# Patient Record
Sex: Male | Born: 1970 | Race: White | Hispanic: No | Marital: Single | Smoking: Current every day smoker
Health system: Southern US, Community
[De-identification: ages and names within clinical notes are randomized; demographics above are authoritative.]

## PROBLEM LIST (undated history)

## (undated) HISTORY — PX: SHOULDER SURGERY: SHX246

---

## 1998-09-30 ENCOUNTER — Ambulatory Visit (HOSPITAL_BASED_OUTPATIENT_CLINIC_OR_DEPARTMENT_OTHER): Admission: RE | Admit: 1998-09-30 | Discharge: 1998-09-30 | Payer: Self-pay | Admitting: Orthopedic Surgery

## 2006-06-04 ENCOUNTER — Emergency Department (HOSPITAL_COMMUNITY): Admission: EM | Admit: 2006-06-04 | Discharge: 2006-06-04 | Payer: Self-pay | Admitting: Emergency Medicine

## 2006-06-12 ENCOUNTER — Ambulatory Visit (HOSPITAL_COMMUNITY): Admission: RE | Admit: 2006-06-12 | Discharge: 2006-06-12 | Payer: Self-pay | Admitting: Orthopedic Surgery

## 2006-06-16 ENCOUNTER — Ambulatory Visit (HOSPITAL_COMMUNITY): Admission: RE | Admit: 2006-06-16 | Discharge: 2006-06-16 | Payer: Self-pay | Admitting: Orthopedic Surgery

## 2011-11-29 ENCOUNTER — Emergency Department (HOSPITAL_BASED_OUTPATIENT_CLINIC_OR_DEPARTMENT_OTHER)
Admission: EM | Admit: 2011-11-29 | Discharge: 2011-11-29 | Disposition: A | Discharge status: Home / Self Care | Payer: Self-pay | Attending: Emergency Medicine | Admitting: Emergency Medicine

## 2011-11-29 ENCOUNTER — Encounter (HOSPITAL_BASED_OUTPATIENT_CLINIC_OR_DEPARTMENT_OTHER): Payer: Self-pay | Admitting: Family Medicine

## 2011-11-29 DIAGNOSIS — X500XXA Overexertion from strenuous movement or load, initial encounter: Secondary | ICD-10-CM | POA: Insufficient documentation

## 2011-11-29 DIAGNOSIS — IMO0002 Reserved for concepts with insufficient information to code with codable children: Secondary | ICD-10-CM | POA: Insufficient documentation

## 2011-11-29 DIAGNOSIS — Z880 Allergy status to penicillin: Secondary | ICD-10-CM | POA: Insufficient documentation

## 2011-11-29 DIAGNOSIS — F17200 Nicotine dependence, unspecified, uncomplicated: Secondary | ICD-10-CM | POA: Insufficient documentation

## 2011-11-29 DIAGNOSIS — S46219A Strain of muscle, fascia and tendon of other parts of biceps, unspecified arm, initial encounter: Secondary | ICD-10-CM

## 2011-11-29 MED ORDER — OXYCODONE-ACETAMINOPHEN 5-325 MG PO TABS
2.0000 | ORAL_TABLET | Freq: Once | ORAL | Status: AC
  Administered 2011-11-29: 2 via ORAL
  Filled 2011-11-29: qty 2

## 2011-11-29 MED ORDER — OXYCODONE-ACETAMINOPHEN 5-325 MG PO TABS: 2.0000 | ORAL_TABLET | Freq: Four times a day (QID) | ORAL | Status: AC | PRN

## 2011-11-29 MED ORDER — IBUPROFEN 800 MG PO TABS
800.0000 mg | ORAL_TABLET | Freq: Once | ORAL | Status: AC
  Administered 2011-11-29: 800 mg via ORAL
  Filled 2011-11-29: qty 1

## 2011-11-29 MED ORDER — IBUPROFEN 800 MG PO TABS: 800.0000 mg | ORAL_TABLET | Freq: Three times a day (TID) | ORAL | Status: AC | PRN

## 2011-11-29 NOTE — ED Notes (Signed)
MD at bedside. 

## 2011-11-29 NOTE — ED Notes (Signed)
Pt c/o right bicep pain after lifting heavy object and falling. Pt denies other injury, no loc. Pt reports h/o "tearing other bicep and it feels the same".

## 2011-11-29 NOTE — Discharge Instructions (Signed)
Biceps Tendon Disruption (Proximal) with Rehab The biceps tendon attaches the biceps muscle to the bones of the elbow and the shoulder. A proximal biceps tendon disruption is a tear of the long head of the tendon that attaches near the shoulder (more common) or a tear in the muscle near the muscle tendon junction (less common). These injuries usually involve a complete tear of the tendon from the bone. However, partial tears are also possible. The biceps muscle works with other muscles to bend the elbow and rotate the palm upward (supinate). A complete biceps rupture will result in about a 10% decrease in elbow bending strength and a 20% decrease in your ability to turn the palm upward, using the wrist. SYMPTOMS   Pain, tenderness, swelling, warmth, or redness over the front of the shoulder.   Pain that gets worse with shoulder and elbow use, especially against resistance (lifting or carrying).   Bruising (contusion) in the arm or elbow after 24 to 48 hours.   Bulge can be seen and felt in the arm.   Limited motion of the shoulder or elbow.   Weakness with attempted elbow bending or rotation of the wrist, such as when using a screwdriver.   Crackling sound (crepitation) when the tendon or shoulder is moved or touched.  CAUSES  A biceps tendon rupture occurs when the tendon is subjected to a force that is greater than it can withstand. For example, a sudden force straightening the elbow while the biceps is flexed, or a direct hit (trauma) (rare). RISK INCREASES WITH:   Sports that involve contact, or throwing and overhead activities (racquet sports, gymnastics, weightlifting, bodybuilding).   Heavy labor.   Poor strength and flexibility.   Failure to warm up properly before activity.  PREVENTION  Warm up and stretch properly before activity.   Maintain physical fitness:   Strength, flexibility, and endurance.   Cardiovascular fitness.   Allow your body to recover between  practices and competition.   Learn and use proper exercise technique.  PROGNOSIS  If treated properly, the symptoms of a proximal biceps tendon disruption usually go away within 12 weeks of injury.  RELATED COMPLICATIONS  Longer healing time if not properly treated, or if not given enough time to heal.   Recurring symptoms, especially if activity is resumed too soon.   Weakness of elbow bending and forearm rotation.   Prolonged disability (uncommon).  TREATMENT Treatment first involves the use of ice and medicine, to reduce pain and inflammation. It is also important to perform strengthening and stretching exercises and to modify activities that cause symptoms to get worse. These exercises may be performed at home or with a therapist. It is not possible to surgically fix the tendon (sew it together). However, surgery may be performed to reinsert the tendon into the arm bone. This will make the arm look normal again. Surgery is most often advised for younger, active individuals, especially those who require strength of wrist rotation.  MEDICATION  If pain medicine is needed, nonsteroidal anti-inflammatory medicines (aspirin and ibuprofen), or other minor pain relievers (acetaminophen), are often advised.   Do not take pain medicine for 7 days before surgery.   Prescription pain relievers may be given if your caregiver thinks they are needed. Use only as directed and only as much as you need.   Corticosteroid injections may be given to help reduce inflammation, but are not usually recommended for this injury.  HEAT AND COLD  Cold treatment (icing) should be applied  for 10 to 15 minutes every 2 to 3 hours for inflammation and pain, and immediately after activity that aggravates your symptoms. Use ice packs or an ice massage.   Heat treatment may be used before performing stretching and strengthening activities prescribed by your caregiver, physical therapist, or athletic trainer. Use a heat  pack or a warm water soak.  SEEK MEDICAL CARE IF:   Symptoms get worse or do not improve in 2 weeks, despite treatment.   New, unexplained symptoms develop. (Drugs used in treatment may produce side effects.)  EXERCISES RANGE OF MOTION (ROM) AND STRETCHING EXERCISES - Biceps Tendon Disruption (Proximal) These exercises may help you when beginning to rehabilitate your injury. Your symptoms may go away with or without further involvement from your physician, physical therapist or athletic trainer. While completing these exercises, remember:   Restoring tissue flexibility helps normal motion to return to the joints. This allows healthier, less painful movement and activity.   An effective stretch should be held for at least 30 seconds.   A stretch should never be painful. You should only feel a gentle lengthening or release in the stretched tissue.  STRETCH - Flexion, Standing  Stand with good posture. With an underhand grip on your right / left hand and an overhand grip on the opposite hand, grasp a broomstick or cane so that your hands are a little more than shoulder width apart.   Keeping your right / left elbow straight and shoulder muscles relaxed, push the stick with your opposite hand to raise your right / left arm in front of your body and then overhead. Raise your arm until you feel a stretch in your right / left shoulder, but before you have increased shoulder pain.   Try to avoid shrugging your right / left shoulder as your arm rises by keeping your shoulder blade tucked down and toward your mid-back spine. Hold __________ seconds.   Slowly return to the starting position.  Repeat __________ times. Complete this exercise __________ times per day. STRETCH - Abduction, Supine  Stand with good posture. With an underhand grip on your right / left hand and an overhand grip on the opposite hand, grasp a broomstick or cane so that your hands are a little more than shoulder-width apart.     Keeping your right / left elbow straight and shoulder muscles relaxed, push the stick with your opposite hand to raise your right / left arm out to the side of your body and then overhead. Raise your arm until you feel a stretch in your right / left shoulder, but before you have increased shoulder pain.   Try to avoid shrugging your right / left shoulder as your arm rises, by keeping your shoulder blade tucked down and toward your mid-back spine. Hold for __________ seconds.   Slowly return to the starting position.  Repeat __________ times. Complete this exercise __________ times per day. ROM - Flexion, Active-Assisted  Lie on your back. You may bend your knees for comfort.   Grasp a broomstick or cane so your hands are about shoulder width apart. Your right / left hand should grip the end of the stick so that your hand is positioned "thumbs-up," as if you were about to shake hands.   Using your healthy arm to lead, raise your right / left arm overhead until you feel a gentle stretch in your shoulder. Hold for right / left seconds.   Use the stick to assist in returning your right /  left arm to its starting position.  Repeat __________ times. Complete this exercise __________ times per day.  STRETCH - Flexion, Standing   Stand facing a wall. Walk your right / left fingers up the wall until you feel a moderate stretch in your shoulder. As your hand gets higher, you may need to step closer to the wall or use a door frame to walk through.   Try to avoid shrugging your right / left shoulder as your arm rises, by keeping your shoulder blade tucked down and toward your mid-back spine.   Hold for __________ seconds. Use your other hand, if needed, to ease out of the stretch and return to the starting position.  Repeat __________ times. Complete this exercise __________ times per day.  ROM - Internal Rotation   Using underhand grips, grasp a stick behind your back with both hands.   While  standing upright with good posture, slide the stick up your back until you feel a mild stretch in the front of your shoulder.   Hold for __________ seconds. Slowly return to your starting position.  Repeat __________ times. Complete this exercise __________ times per day.  STRETCH - Internal Rotation  Place your right / left hand behind your back, palm-up.   Throw a towel or belt over your opposite shoulder. Grasp the towel with your right / left hand.   While keeping an upright posture, gently pull up on the towel until you feel a stretch in the front of your right / left shoulder.   Avoid shrugging your right / left shoulder as your arm rises, by keeping your shoulder blade tucked down and toward your mid-back spine.   Hold for __________ seconds. Release the stretch by lowering your opposite hand.  Repeat __________ times. Complete this exercise __________ times per day. STRETCH - Elbow Flexors   Lie on a firm bed or countertop on your back. Be sure that you are in a comfortable position which will allow you to relax your arm muscles.   Place a folded towel under your right / left upper arm, so that your elbow and shoulder are at the same height. Extend your arm; your elbow should not rest on the bed or towel.   Allow the weight of your hand to straighten your elbow. Keep your arm and chest muscles relaxed. Your caregiver may ask you to increase the intensity of your stretch by adding a small wrist or hand weight.   Hold for __________ seconds. You should feel a stretch on the inside of your elbow. Slowly return to the starting position.  Repeat __________ times. Complete this exercise __________ times per day. STRENGTHENING EXERCISES - Biceps Tendon Disruption (Proximal) These exercises may help you regain your strength after your physician has discontinued your restraint in a cast or brace. They may resolve your symptoms with or without further involvement from your physician,  physical therapist or athletic trainer. While completing these exercises, remember:   Muscles can gain both the endurance and the strength needed for everyday activities through controlled exercises.   Complete these exercises as instructed by your physician, physical therapist or athletic trainer. Progress the resistance and repetitions only as guided.   You may experience muscle soreness or fatigue, but the pain or discomfort you are trying to eliminate should never worsen during these exercises. If this pain does get worse, stop and make sure you are following the directions exactly. If the pain is still present after adjustments, discontinue the exercise  until you can discuss the trouble with your clinician.  STRENGTH - Elbow Flexors, Isometric   Stand or sit upright on a firm surface. Place your right / left arm so that your hand is palm-up and at the height of your waist.   Place your opposite hand on top of your forearm. Gently push down as your right / left arm resists. Push as hard as you can with both arms, without causing any pain or movement at your right / left elbow. Hold this stationary position for __________ seconds.   Gradually release the tension in both arms. Allow your muscles to relax completely before repeating.  Repeat __________ times. Complete this exercise __________ times per day. STRENGTH - Shoulder Flexion, Isometric  With good posture and facing a wall, stand or sit about 4-6 inches away.   Keeping your right / left elbow straight, gently press the top of your fist into the wall. Increase the pressure gradually until you are pressing as hard as you can without shrugging your shoulder or increasing any shoulder discomfort.   Hold for __________ seconds.   Release the tension slowly. Relax your shoulder muscles completely before you the next repetition.  Repeat __________ times. Complete this exercise __________ times per day.  STRENGTH - Elbow Flexors,  Supinated  With good posture, stand or sit on a firm chair without armrests. Allow your right / left arm to rest at your side with your palm facing forward.   Holding a __________weight or gripping a rubber exercise band or tubing, bring your hand toward your shoulder.   Allow your muscles to control the resistance as your hand returns to your side.  Repeat __________ times. Complete this exercise __________ times per day.  STRENGTH - Elbow Flexors, Neutral  With good posture, stand or sit on a firm chair without armrests. Allow your right / left arm to rest at your side with your thumb facing forward.   Holding a __________ weight or gripping a rubber exercise band or tubing, bring your hand toward your shoulder.   Allow your muscles to control the resistance as your hand returns to your side.  Repeat __________ times. Complete this exercise __________ times per day.  STRENGTH - Shoulder Flexion   Stand or sit with good posture. Grasp a __________ weight, or an exercise band or tubing, so that your hand is "thumbs-up," like when you shake hands.   Slowly lift your right / left arm as far as you can without increasing any shoulder pain. Initially, many people can only raise their hand to shoulder height.   Avoid shrugging your right / left shoulder as your arm rises, by keeping your shoulder blade tucked down and toward your mid-back spine.   Hold for __________ seconds. Control the descent of your hand as you slowly return to your starting position.  Repeat __________ times. Complete this exercise __________ times per day.  STRENGTH - Forearm Supinators   Sit with your right / left forearm supported on a table, keeping your elbow below shoulder height. Rest your hand over the edge, palm down.   Gently grip a hammer or a soup ladle.   Without moving your elbow, slowly turn your palm and hand upward to a "thumbs-up" position.   Hold this position for __________ seconds. Slowly return  to the starting position.  Repeat __________ times. Complete this exercise __________ times per day.  STRENGTH - Forearm Pronators   Sit with your right / left forearm supported on  a table, keeping your elbow below shoulder height. Rest your hand over the edge, palm up.   Gently grip a hammer or a soup ladle.   Without moving your elbow, slowly turn your palm and hand upward to a "thumbs-up" position.   Hold this position for __________ seconds. Slowly return to the starting position.  Repeat __________ times. Complete this exercise __________ times per day.  Document Released: 08/22/2005 Document Revised: 08/11/2011 Document Reviewed: 12/04/2008 [facility] Patient Information 2012 [place], [place].

## 2011-11-29 NOTE — ED Provider Notes (Signed)
History     CSN: [id number]  Arrival date & time 11/29/11  1191   First MD Initiated Contact with Patient 11/29/11 1014      Chief Complaint  Patient presents with  . Arm Pain    (Consider location/radiation/quality/duration/timing/severity/associated sxs/prior treatment) HPI Patient is a 41 yo male who presents after developing sudden pain and swelling in his right upper arm.  This occurred when patient was carrying some heavy furniture which was suddenly jostled.  The patient felt a pop and now complains of pain that is 8/10 and similar to when he ruptured his left biceps tendon some years ago.  Pain is worse with palpation and movement and better with rest.There are no other associated or modifying factors.  History reviewed. No pertinent past medical history.  Past Surgical History  Procedure Date  . Shoulder surgery     History reviewed. No pertinent family history.  History  Substance Use Topics  . Smoking status: Current Everyday Smoker -- 1.0 packs/day    Types: Cigarettes  . Smokeless tobacco: Not on file  . Alcohol Use: Yes      Review of Systems  Constitutional: Negative.   HENT: Negative.   Eyes: Negative.   Respiratory: Negative.   Cardiovascular: Negative.   Gastrointestinal: Negative.   Genitourinary: Negative.   Musculoskeletal:       See HPI  Skin: Negative.   Neurological: Negative.   Hematological: Negative.   Psychiatric/Behavioral: Negative.   All other systems reviewed and are negative.    Allergies  Penicillins  Home Medications   Current Outpatient Rx  Name Route Sig Dispense Refill  . IBUPROFEN 800 MG PO TABS Oral Take 1 tablet (800 mg total) by mouth every 8 (eight) hours as needed for pain. 30 tablet 0  . OXYCODONE-ACETAMINOPHEN 5-325 MG PO TABS Oral Take 2 tablets by mouth every 6 (six) hours as needed for pain. 30 tablet 0    BP 122/79  Pulse 68  Temp(Src) 97.6 F (36.4 C) (Oral)  Resp 18  Ht 5\' 7"  (1.702 m)  Wt 178  lb (80.74 kg)  BMI 27.88 kg/m2  SpO2 99%  Physical Exam  Nursing note and vitals reviewed. GEN: Well-developed, well-nourished male in no distress HEENT: Atraumatic, normocephalic.  EYES: PERRLA BL, no scleral icterus. NECK: Trachea midline, no meningismus CV: regular rate and rhythm.  PULM: No respiratory distress.   GI: soft, non-tender. No guarding, rebound, or tenderness. + bowel sounds  GU: deferred Neuro: cranial nerves 2-12 intact, no abnormalities of strength or sensation, A and O x 3 MSK: swelling and TTP over the distal portion of the right upper arm consistent with biceps tendon rupture.  The RUE is NVI Skin: No rashes petechiae, purpura, or jaundice Psych: no abnormality of mood   ED Course  Procedures (including critical care time)  Labs Reviewed - No data to display No results found.   1. Biceps tendon rupture       MDM  Patient was evaluated by myself.  Presentation was consistent with biceps tendon rupture.  He was treated with pain medications as well as ACE wrap, sling, ice pack, and NSAIDs.  Patient was referred to ortho on call and also to Dr. Pearletha Forge of sports medicine as the patient was interested in non-surgical options as well.  The patient was discharged in good condition with prescriptions.          Cyndra Numbers, MD 11/29/11 [phone]

## 2011-11-30 ENCOUNTER — Encounter: Payer: Self-pay | Admitting: Family Medicine

## 2011-11-30 ENCOUNTER — Ambulatory Visit (INDEPENDENT_AMBULATORY_CARE_PROVIDER_SITE_OTHER): Payer: Self-pay | Admitting: Family Medicine

## 2011-11-30 VITALS — BP 134/78 | HR 61 | Temp 97.6°F | Ht 68.0 in | Wt 178.0 lb

## 2011-11-30 DIAGNOSIS — M25519 Pain in unspecified shoulder: Secondary | ICD-10-CM

## 2011-11-30 DIAGNOSIS — M25511 Pain in right shoulder: Secondary | ICD-10-CM

## 2011-11-30 NOTE — Patient Instructions (Signed)
You have torn the long head of your biceps tendon. In most instances this is not treated with surgery unless you do not improve with conservative care (as opposed to when you tear it at the elbow - these should be repaired). 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 Start with sling for comfort. Icing 15 minutes at a time 3-4 times a day. Aleve 2 tabs twice a day with food for pain and inflammation. Percocet as needed for severe pain. Come out of sling a couple times a day to do elbow and shoulder range of motion exercises. When you feel comfortable, start arm curls and supination exercises - start with low weight and theraband, work your way up. Follow up with me in 6 weeks for a recheck on how you're doing. Resuming activities depends on your symptoms. You can drive now without a sling if this feels comfortable. Would expect it to be 4-6 weeks before you can drive a stick shift.

## 2011-12-01 ENCOUNTER — Encounter: Payer: Self-pay | Admitting: Family Medicine

## 2011-12-01 DIAGNOSIS — M25511 Pain in right shoulder: Secondary | ICD-10-CM | POA: Insufficient documentation

## 2011-12-01 NOTE — Assessment & Plan Note (Signed)
Right proximal biceps tendon rupture - we had a long discussion regarding surgical vs nonsurgical intervention for this.  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  Noted that with proximal tears the strength gains from surgery are only mild, for most people nonsurgical management recommended.  He is not concerned about cosmetic appearance of this.  He would like to try nonsurgical management.  Discussed if having severe pain 2-3 months out can consider tenodesis though this is unlikely.  Start ROM exercises and advance to strengthening as tolerated.  Icing, sling, pain medication (did not need refill today).  Aleve as needed.  See instructions for further.  Handout provided.

## 2011-12-01 NOTE — Progress Notes (Signed)
Subjective:    Patient ID: Jamie Horton, male    DOB: May 21, 1971, 41 y.o.   MRN: [id number]  PCP: None  HPI 41 yo M here for right shoulder injury.  Patient reports he was moving heavy furniture on 3/26 when he tripped and fell backwards holding this. Felt an immediate twinge anterior right shoulder, pain, felt similar to when he ruptured biceps tendon on left side in 2007 - had this surgically repaired. Went to ED - diagnosed with proximal biceps tendon rupture, placed in sling and given oxycodone, referred here. Has been icing as well. Overall pain has improved since then - already noticing biceps deformity. No longer with issues on left side. 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  History reviewed. No pertinent past medical history.  Current Outpatient Prescriptions on File Prior to Visit  Medication Sig Dispense Refill  . ibuprofen (ADVIL,MOTRIN) 800 MG tablet Take 1 tablet (800 mg total) by mouth every 8 (eight) hours as needed for pain.  30 tablet  0  . oxyCODONE-acetaminophen (PERCOCET) 5-325 MG per tablet Take 2 tablets by mouth every 6 (six) hours as needed for pain.  30 tablet  0    Past Surgical History  Procedure Date  . Shoulder surgery     right - bone spurs removed from clavicle ~2003    Allergies  Allergen Reactions  . Penicillins     History   Social History  . Marital Status: Single    Spouse Name: N/A    Number of Children: N/A  . Years of Education: N/A   Occupational History  . Not on file.   Social History Main Topics  . Smoking status: Current Everyday Smoker -- 1.5 packs/day    Types: Cigarettes  . Smokeless tobacco: Not on file  . Alcohol Use: Yes  . Drug Use: No  . Sexually Active: Not on file   Other Topics Concern  . Not on file   Social History Narrative  . No narrative on file    Family History  Problem Relation Age of  Onset  . Sudden death Neg Hx   . Diabetes Neg Hx   . Heart attack Neg Hx   . Hyperlipidemia Neg Hx   . Hypertension Neg Hx     BP 134/78  Pulse 61  Temp(Src) 97.6 F (36.4 C) (Oral)  Ht 5\' 8"  (1.727 m)  Wt 178 lb (80.74 kg)  BMI 27.06 kg/m2  Review of Systems See HPI above.    Objective:   Physical Exam Gen: NAD  R shoulder: Developing popeye deformity right bicep.  Difficult to discern bruising as patient has tattoo sleeves. TTP proximal biceps and bicipital groove.  No AC or other TTP about shoulder. FROM with 5/5 strength empty can, resisted IR/ER. Negative hawkins, neers. Positive speeds and yergasons. Negative apprehension. NVI distally.  L shoulder: FROM without pain or weakness.  MSK u/s:  Right biceps tendon ruptured - on long view can visualize where tendon stops abruptly with edema proximally.  Supraspinatus and subscapularis tendons/muscles appear intact on trans and long views.  No other abnormalities identified. Images saved.    Assessment & Plan:  1. Right proximal biceps tendon rupture - we had a long discussion regarding surgical vs nonsurgical intervention for this.  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  Noted that with proximal tears  the strength gains from surgery are only mild, for most people nonsurgical management recommended.  He is not concerned about cosmetic appearance of this.  He would like to try nonsurgical management.  Discussed if having severe pain 2-3 months out can consider tenodesis though this is unlikely.  Start ROM exercises and advance to strengthening as tolerated.  Icing, sling, pain medication (did not need refill today).  Aleve as needed.  See instructions for further.  Handout provided.

## 2021-02-21 ENCOUNTER — Emergency Department (HOSPITAL_BASED_OUTPATIENT_CLINIC_OR_DEPARTMENT_OTHER): Payer: Self-pay

## 2021-02-21 ENCOUNTER — Emergency Department (HOSPITAL_BASED_OUTPATIENT_CLINIC_OR_DEPARTMENT_OTHER)
Admission: EM | Admit: 2021-02-21 | Discharge: 2021-02-21 | Disposition: A | Discharge status: Home / Self Care | Payer: Self-pay | Attending: Emergency Medicine | Admitting: Emergency Medicine

## 2021-02-21 ENCOUNTER — Encounter (HOSPITAL_BASED_OUTPATIENT_CLINIC_OR_DEPARTMENT_OTHER): Payer: Self-pay

## 2021-02-21 ENCOUNTER — Other Ambulatory Visit: Payer: Self-pay

## 2021-02-21 DIAGNOSIS — F17210 Nicotine dependence, cigarettes, uncomplicated: Secondary | ICD-10-CM | POA: Insufficient documentation

## 2021-02-21 DIAGNOSIS — R079 Chest pain, unspecified: Secondary | ICD-10-CM

## 2021-02-21 DIAGNOSIS — Z20822 Contact with and (suspected) exposure to covid-19: Secondary | ICD-10-CM | POA: Insufficient documentation

## 2021-02-21 DIAGNOSIS — R0789 Other chest pain: Secondary | ICD-10-CM | POA: Insufficient documentation

## 2021-02-21 LAB — BASIC METABOLIC PANEL
Anion gap: 12 (ref 5–15)
BUN: 23 mg/dL — ABNORMAL HIGH (ref 6–20)
CO2: 23 mmol/L (ref 22–32)
Calcium: 9.8 mg/dL (ref 8.9–10.3)
Chloride: 104 mmol/L (ref 98–111)
Creatinine, Ser: 1.14 mg/dL (ref 0.61–1.24)
GFR, Estimated: >60 mL/min (ref >60)
Glucose, Bld: 133 mg/dL — ABNORMAL HIGH (ref 70–99)
Potassium: 4.2 mmol/L (ref 3.5–5.1)
Sodium: 139 mmol/L (ref 135–145)

## 2021-02-21 LAB — RESP PANEL BY RT-PCR (FLU A&B, COVID) ARPGX2
Influenza A by PCR: NEGATIVE
Influenza B by PCR: NEGATIVE
SARS Coronavirus 2 by RT PCR: NEGATIVE

## 2021-02-21 LAB — CBC
HCT: 50.4 % (ref 39.0–52.0)
Hemoglobin: 17.6 g/dL — ABNORMAL HIGH (ref 13.0–17.0)
MCH: 31.5 pg (ref 26.0–34.0)
MCHC: 34.9 g/dL (ref 30.0–36.0)
MCV: 90.2 fL (ref 80.0–100.0)
Platelets: 314 10*3/uL (ref 150–400)
RBC: 5.59 MIL/uL (ref 4.22–5.81)
RDW: 12.9 % (ref 11.5–15.5)
WBC: 10.5 10*3/uL (ref 4.0–10.5)
nRBC: 0 % (ref 0.0–0.2)

## 2021-02-21 LAB — HEPATIC FUNCTION PANEL
ALT: 23 U/L (ref 0–44)
AST: 30 U/L (ref 15–41)
Albumin: 4.5 g/dL (ref 3.5–5.0)
Alkaline Phosphatase: 74 U/L (ref 38–126)
Bilirubin, Direct: 0.1 mg/dL (ref 0.0–0.2)
Indirect Bilirubin: 0.4 mg/dL (ref 0.3–0.9)
Total Bilirubin: 0.5 mg/dL (ref 0.3–1.2)
Total Protein: 8 g/dL (ref 6.5–8.1)

## 2021-02-21 LAB — TROPONIN I (HIGH SENSITIVITY)
Troponin I (High Sensitivity): 4 ng/L (ref <18)
Troponin I (High Sensitivity): 4 ng/L (ref <18)

## 2021-02-21 LAB — LIPASE, BLOOD: Lipase: 31 U/L (ref 11–51)

## 2021-02-21 LAB — D-DIMER, QUANTITATIVE: D-Dimer, Quant: <0.27 ug/mL-FEU (ref 0.00–0.50)

## 2021-02-21 MED ORDER — KETOROLAC TROMETHAMINE 15 MG/ML IJ SOLN
15.0000 mg | Freq: Once | INTRAMUSCULAR | Status: AC
  Administered 2021-02-21: 15 mg via INTRAVENOUS
  Filled 2021-02-21: qty 1

## 2021-02-21 MED ORDER — ASPIRIN 81 MG PO CHEW
324.0000 mg | CHEWABLE_TABLET | Freq: Once | ORAL | Status: AC
  Administered 2021-02-21: 324 mg via ORAL
  Filled 2021-02-21: qty 4

## 2021-02-21 MED ORDER — FENTANYL CITRATE (PF) 100 MCG/2ML IJ SOLN
50.0000 ug | Freq: Once | INTRAMUSCULAR | Status: AC
  Administered 2021-02-21: 50 ug via INTRAVENOUS
  Filled 2021-02-21: qty 2

## 2021-02-21 MED ORDER — IOHEXOL 350 MG/ML SOLN
100.0000 mL | Freq: Once | INTRAVENOUS | Status: AC | PRN
  Administered 2021-02-21: 100 mL via INTRAVENOUS

## 2021-02-21 MED ORDER — NAPROXEN 500 MG PO TABS: 500.0000 mg | ORAL_TABLET | Freq: Two times a day (BID) | ORAL | 0 refills | Status: AC

## 2021-02-21 MED ORDER — ONDANSETRON HCL 4 MG PO TABS: 4.0000 mg | ORAL_TABLET | Freq: Four times a day (QID) | ORAL | 0 refills | Status: AC

## 2021-02-21 MED ORDER — ALBUTEROL SULFATE HFA 108 (90 BASE) MCG/ACT IN AERS
2.0000 | INHALATION_SPRAY | Freq: Once | RESPIRATORY_TRACT | Status: AC
  Administered 2021-02-21: 2 via RESPIRATORY_TRACT
  Filled 2021-02-21: qty 6.7

## 2021-02-21 NOTE — ED Provider Notes (Signed)
[facility] Emergency Department Provider Note MRN:  [id number]  Arrival date & time: 02/21/21     Chief Complaint   Chest Pain and Shortness of Breath   History of Present Illness   Jamie Horton is a 50 y.o. year-old male with no pertinent past medical history presenting to the ED with chief complaint of chest pain.  Location: Center and right side of the chest Duration: 2 to 3 hours Onset: Sudden Timing: Const Description: Pressure Severity: Severe Exacerbating/Alleviating Factors: None Associated Symptoms: Significant shortness of breath; recent nausea vomiting and diarrhea 3 days ago but it went away Pertinent Negatives: Denies abdominal pain, no numbness or weakness to the arms or legs.   Review of Systems  A complete 10 system review of systems was obtained and all systems are negative except as noted in the HPI and PMH.   Patient's Health History   History reviewed. No pertinent past medical history.  Past Surgical History:  Procedure Laterality Date   SHOULDER SURGERY     right - bone spurs removed from clavicle ~2003    Family History  Problem Relation Age of Onset   Sudden death Neg Hx    Diabetes Neg Hx    Heart attack Neg Hx    Hyperlipidemia Neg Hx    Hypertension Neg Hx     Social History   Socioeconomic History   Marital status: Single    Spouse name: Not on file   Number of children: Not on file   Years of education: Not on file   Highest education level: Not on file  Occupational History   Not on file  Tobacco Use   Smoking status: Every Day    Packs/day: 1.50    Pack years: 0.00    Types: Cigarettes   Smokeless tobacco: Not on file  Substance and Sexual Activity   Alcohol use: Not Currently   Drug use: Yes    Types: Marijuana   Sexual activity: Not on file  Other Topics Concern   Not on file  Social History Narrative   Not on file   Social Determinants of Health   Financial Resource Strain: Not on  file  Food Insecurity: Not on file  Transportation Needs: Not on file  Physical Activity: Not on file  Stress: Not on file  Social Connections: Not on file  Intimate Partner Violence: Not on file     Physical Exam   Vitals:   02/21/21 7371  Pulse: (!) 56  Resp: (!) 32  Temp: 98.2 F (36.8 C)  SpO2: 100%    CONSTITUTIONAL: Well-appearing, NAD NEURO:  Alert and oriented x 3, no focal deficits EYES:  eyes equal and reactive ENT/NECK:  no LAD, no JVD CARDIO: Regular rate, well-perfused, normal S1 and S2 PULM:  CTAB no wheezing or rhonchi GI/GU:  normal bowel sounds, non-distended, non-tender MSK/SPINE:  No gross deformities, no edema SKIN:  no rash, atraumatic PSYCH:  Appropriate speech and behavior  *Additional and/or pertinent findings included in MDM below  Diagnostic and Interventional Summary    EKG Interpretation  Date/Time:  February 21, 2021 at 06: 38: 16 Ventricular Rate:  75 PR Interval:  171 QRS Duration: 95 QT Interval:  421 QTC Calculation: 435 R Axis:     Text Interpretation: Sinus rhythm, no ischemic features Confirmed by Dr. Kennis Carina at 6:47 AM        Labs Reviewed  CBC  BASIC METABOLIC PANEL  D-DIMER, QUANTITATIVE  TROPONIN  I (HIGH SENSITIVITY)    DG Chest Port 1 View    (Results Pending)    Medications  aspirin chewable tablet 324 mg (has no administration in time range)  fentaNYL (SUBLIMAZE) injection 50 mcg (has no administration in time range)     Procedures  /  Critical Care Procedures  ED Course and Medical Decision Making  I have reviewed the triage vital signs, the nursing notes, and pertinent available records from the EMR.  Listed above are laboratory and imaging tests that I personally ordered, reviewed, and interpreted and then considered in my medical decision making (see below for details).  Considering ACS, less likely PE or dissection.  Anxiety could be a contributing etiology.  EKG is without concerning features,  awaiting labs, chest x-ray and will reassess.  Signed out to oncoming provider at shift change.       Elmer Sow. Pilar Plate, MD [facility] Emergency Medicine [facility] [email or web address]  Final Clinical Impressions(s) / ED Diagnoses     ICD-10-CM   1. Chest pain, unspecified type  R07.9       ED Discharge Orders     None        Discharge Instructions Discussed with and Provided to Patient:   Discharge Instructions   None       Sabas Sous, MD 02/21/21 [phone]

## 2021-02-21 NOTE — ED Notes (Signed)
Patient transported to CT 

## 2021-02-21 NOTE — ED Triage Notes (Signed)
Chest pain and shortness of breath started around 4 am this morning.

## 2021-02-21 NOTE — ED Provider Notes (Signed)
Assumed care from Dr. Orland Dec at 7 AM.  Patient presenting today due to sudden onset of chest pain and shortness of breath that started around 4 AM this morning.  Patient does report that on Thursday night/Friday morning he had some vomiting and diarrhea but was better by Friday afternoon and felt okay yesterday.  Since being awakened this morning his symptoms have just progressively worsened.  Patient is squirming around the bed reports significant pleuritic pain with taking a deep breath on the right side and inability to catch his breath.  He also has pain in the upper abdomen on my exam.  Pain is reproducible in the right side of the chest when taking a deep breath.  Patient's labs are normal in appearance with normal CBC, D-dimer, troponin and BMP.  However in the setting of having diarrhea and vomiting, upper abdominal pain will get LFTs, lipase.  Patient's chest x-ray shows possible emphysema in the right lobe which seems unusual.  There was no evidence of pneumonia however concern for possible COVID or developing pneumonia.  Lower suspicion for PE at this time but given the abrupt nature of the pain location the chest and the abdomen we will do a CTA of the chest abdomen pelvis to rule out dissection.  Low suspicion for ACS at this time.  Patient's EKG showing sinus bradycardia with occasional PACs but otherwise normal.  Patient given Toradol for pain as he was reporting that the fentanyl made it worse.  10:29 AM Patient's chest pain did improve after Toradol.  CTA of the chest abdomen pelvis dissection study was negative except for emphysema.  Patient has continued to have heart rate in the 40s but feel that is most likely his baseline.  Oxygen saturation of 100% on room air.  Remaining labs are normal.  Suspect patient may have a component of pleurisy may be related to the recent viral illness.  Evidence of pericarditis today and low suspicion for myocarditis.  Findings discussed with the patient.  He  was given return precautions.   Gwyneth Sprout, MD 02/21/21 1029

## 2022-10-12 IMAGING — DX DG CHEST 1V PORT
1 series · 1 of 1 positions shown · non-contrast
Comparison: Left shoulder series 06/04/2006.

CLINICAL DATA: 49-year-old male with chest pain and shortness of
breath since 4844 hours. Smoker.

EXAM:
PORTABLE CHEST 1 VIEW

[chest ap]
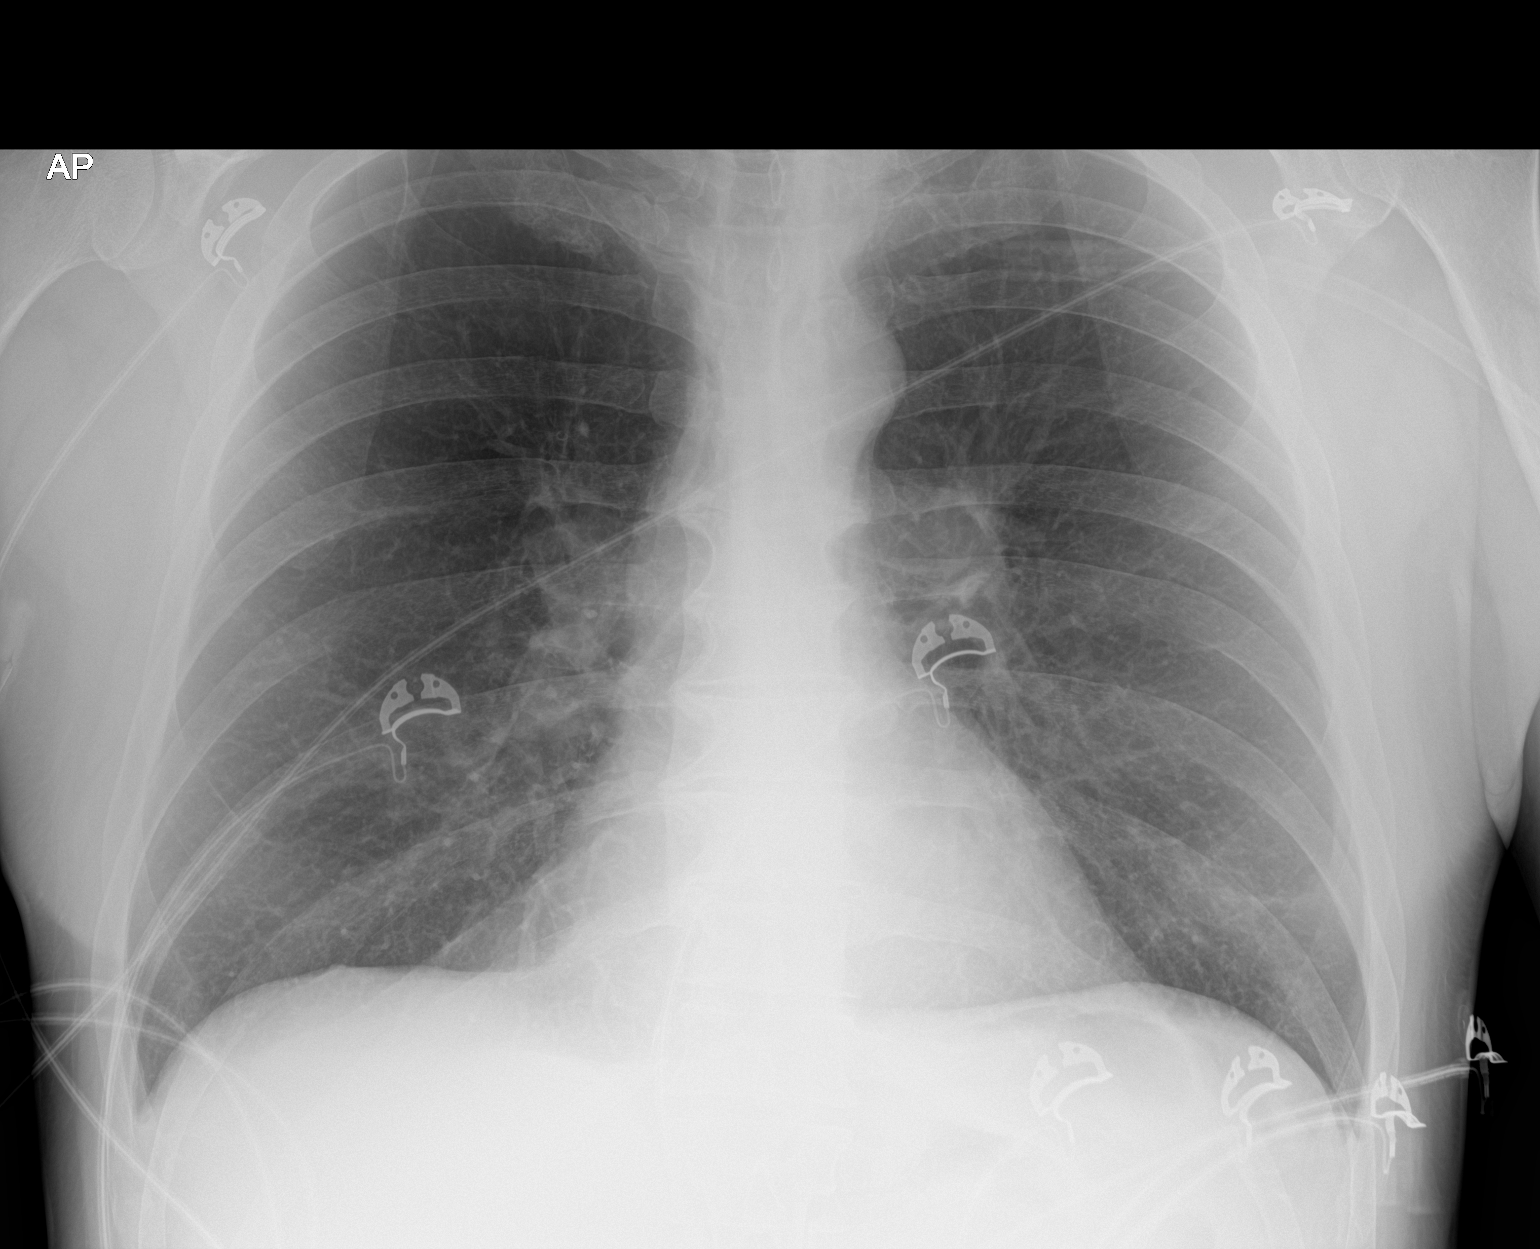

[1 of 1 positions shown; findings below may reference images not displayed]

FINDINGS: Portable AP upright view at 3955 hours. Lung volumes and mediastinal
contours are within normal limits. Attenuation of right upper lobe
bronchovascular markings with mildly increased interstitial opacity
elsewhere, likely emphysema and/or smoking related. Visualized
tracheal air column is within normal limits. No pneumothorax,
pulmonary edema or confluent opacity. No pleural effusion. No acute
osseous abnormality identified. Negative visible bowel gas pattern.
IMPRESSION: 1. Probable right upper lobe emphysema, and mild smoking related
interstitial changes.
2.  No acute cardiopulmonary abnormality identified.
